# Patient Record
Sex: Male | Born: 1965 | Race: White | Hispanic: No | Marital: Single | Smoking: Former smoker
Health system: Southern US, Community
[De-identification: ages and names within clinical notes are randomized; demographics above are authoritative.]

## PROBLEM LIST (undated history)

## (undated) DIAGNOSIS — E039 Hypothyroidism, unspecified: Secondary | ICD-10-CM

## (undated) DIAGNOSIS — R4702 Dysphasia: Secondary | ICD-10-CM

## (undated) DIAGNOSIS — R569 Unspecified convulsions: Secondary | ICD-10-CM

## (undated) DIAGNOSIS — F329 Major depressive disorder, single episode, unspecified: Secondary | ICD-10-CM

## (undated) DIAGNOSIS — Q8711 Prader-Willi syndrome: Secondary | ICD-10-CM

## (undated) DIAGNOSIS — G4700 Insomnia, unspecified: Secondary | ICD-10-CM

## (undated) DIAGNOSIS — E119 Type 2 diabetes mellitus without complications: Secondary | ICD-10-CM

## (undated) DIAGNOSIS — F209 Schizophrenia, unspecified: Secondary | ICD-10-CM

## (undated) DIAGNOSIS — F32A Depression, unspecified: Secondary | ICD-10-CM

## (undated) DIAGNOSIS — I10 Essential (primary) hypertension: Secondary | ICD-10-CM

---
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
## 2005-12-29 ENCOUNTER — Emergency Department: Payer: Self-pay | Admitting: Emergency Medicine

## 2006-01-10 ENCOUNTER — Emergency Department: Payer: Self-pay | Admitting: Emergency Medicine

## 2006-08-07 ENCOUNTER — Emergency Department: Payer: Self-pay | Admitting: Emergency Medicine

## 2006-08-08 ENCOUNTER — Ambulatory Visit: Payer: Self-pay | Admitting: Emergency Medicine

## 2007-08-13 ENCOUNTER — Emergency Department: Payer: Self-pay | Admitting: Emergency Medicine

## 2008-09-03 ENCOUNTER — Ambulatory Visit: Payer: Self-pay | Admitting: Family Medicine

## 2009-03-22 ENCOUNTER — Emergency Department: Payer: Self-pay | Admitting: Emergency Medicine

## 2009-06-04 ENCOUNTER — Emergency Department: Payer: Self-pay

## 2009-08-10 ENCOUNTER — Encounter: Payer: Self-pay | Admitting: Family Medicine

## 2009-11-14 ENCOUNTER — Emergency Department: Payer: Self-pay | Admitting: Emergency Medicine

## 2018-11-14 ENCOUNTER — Other Ambulatory Visit: Payer: Self-pay | Admitting: Family Medicine

## 2018-11-14 ENCOUNTER — Other Ambulatory Visit: Payer: Self-pay | Admitting: Orthopaedic Surgery

## 2018-11-14 DIAGNOSIS — S42209A Unspecified fracture of upper end of unspecified humerus, initial encounter for closed fracture: Secondary | ICD-10-CM

## 2018-11-16 ENCOUNTER — Ambulatory Visit
Admission: RE | Admit: 2018-11-16 | Discharge: 2018-11-16 | Disposition: A | Discharge status: Home / Self Care | Payer: Medicare Other | Source: Ambulatory Visit | Attending: Orthopaedic Surgery | Admitting: Orthopaedic Surgery

## 2018-11-16 DIAGNOSIS — S42209A Unspecified fracture of upper end of unspecified humerus, initial encounter for closed fracture: Secondary | ICD-10-CM

## 2018-11-20 ENCOUNTER — Other Ambulatory Visit (HOSPITAL_COMMUNITY)
Admission: RE | Admit: 2018-11-20 | Discharge: 2018-11-20 | Disposition: A | Discharge status: Home / Self Care | Payer: Medicare Other | Source: Ambulatory Visit | Attending: Orthopaedic Surgery | Admitting: Orthopaedic Surgery

## 2018-11-20 DIAGNOSIS — Z01812 Encounter for preprocedural laboratory examination: Secondary | ICD-10-CM | POA: Insufficient documentation

## 2018-11-20 DIAGNOSIS — Z20828 Contact with and (suspected) exposure to other viral communicable diseases: Secondary | ICD-10-CM | POA: Insufficient documentation

## 2018-11-20 LAB — SARS CORONAVIRUS 2 (TAT 6-24 HRS): SARS Coronavirus 2: NEGATIVE

## 2018-11-20 NOTE — H&P (Signed)
PREOPERATIVE H&P  Chief Complaint: RIGHT HUMERAL FRACTURE  HPI: Michael Richmond is a 53 y.o. male who presents for preoperative history and physical prior to scheduled surgery, INTRAMEDULLARY (IM) NAIL HUMERAL.  Patient has a past medical history significant for Prader Wili Syndrom, schizoaffective disorder, DM, seizure disorder and traumatic brain injury. He lives in a group home.   He had a fall  off the bed on 11-11-2018. He  has pain in his right shoulder. He has been trying to use it but has pain afterwards.  He is unable to participate fully in he examination due to above patient factors. CT was obtained to better understand the fracture fragment.   CT scan of the right shoulder showed a Severely comminuted fracture of the surgical neck of the left proximal humerus with 2.5 cm of anterior displacement of the proximal shaft. Comminuted fracture of greater tuberosity without significant displacement.   His symptoms are rated as moderate to severe, and have been worsening.  This is significantly impairing activities of daily living.    Please see clinic note for further details on this patient's care.    He and his caregiver have elected for surgical management.   Social History   Socioeconomic History  . Marital status: Single    Spouse name: Not on file  . Number of children: Not on file  . Years of education: Not on file  . Highest education level: Not on file  Occupational History  . Not on file  Social Needs  . Financial resource strain: Not on file  . Food insecurity    Worry: Not on file    Inability: Not on file  . Transportation needs    Medical: Not on file    Non-medical: Not on file  Tobacco Use  . Smoking status: Not on file  Substance and Sexual Activity  . Alcohol use: Not on file  . Drug use: Not on file  . Sexual activity: Not on file  Lifestyle  . Physical activity    Days per week: Not on file    Minutes per session: Not on file  . Stress: Not on  file  Relationships  . Social Herbalist on phone: Not on file    Gets together: Not on file    Attends religious service: Not on file    Active member of club or organization: Not on file    Attends meetings of clubs or organizations: Not on file    Relationship status: Not on file  Other Topics Concern  . Not on file  Social History Narrative  . Not on file   No family history on file. Not on File Prior to Admission medications   Not on File    Positive ROS: All other systems have been reviewed and were otherwise negative with the exception of those mentioned in the HPI and as above.  Physical Exam: General: Alert, no acute distress Cardiovascular: No pedal edema Respiratory: No cyanosis, no use of accessory musculature GI: No organomegaly, abdomen is soft and non-tender Skin: No lesions in the area of chief complaint Neurologic: Sensation intact distally Psychiatric: Patient is competent for consent with normal mood and affect Lymphatic: No axillary or cervical lymphadenopathy  MUSCULOSKELETAL:  Right shoulder: a limited range of motion secondary to pain. Distal motor and sensory function is limited due to patient cooperation. He is able to wriggle the fingers and has reported sensation intact. Warm well perfused arm , distal extremity.  Imaging: CT right shoulder:  1. Severely comminuted fracture of the surgical neck of the left proximal humerus with 2.5 cm of anterior displacement of the proximal shaft. Comminuted fracture of greater tuberosity without significant displacement. No involvement of the articular surface of the humeral head. 2. Contusion versus partial tear of the anterior deltoid muscle which contains a few bony fragments.  Assessment: RIGHT HUMERAL FRACTURE  Plan: Plan for Procedure(s): INTRAMEDULLARY (IM) NAIL HUMERAL  We discussed with the patient and his caregiver regarding the results of his CT scan. The various methods of treatment  have been discussed with the patient and family. After consideration of risks, benefits and other options for treatment, the patient and his POA have consented to IM nail of right humerus fracture.   The risks benefits and alternatives were discussed with the patient including but not limited to the risks of nonoperative treatment, versus surgical intervention including infection, bleeding, nerve injury,  blood clots, cardiopulmonary complications, morbidity, mortality, among others, and they were willing to proceed.   The patient's POA, Bobbye Riggs, [phone],  acknowledged the explanation, agreed to proceed with the plan and consent was signed.   Operative Plan: IM nail right humerus fracture Discharge Medications: Standard (Tylenol, Mobic, Oxycodone, Zofran) DVT Prophylaxis: None Physical Therapy: Outpatient PT Special Discharge needs: Sling   Vernetta Honey, PA-C  11/20/2018 1:08 PM

## 2018-11-21 ENCOUNTER — Encounter (HOSPITAL_COMMUNITY): Payer: Self-pay | Admitting: *Deleted

## 2018-11-21 NOTE — Anesthesia Preprocedure Evaluation (Addendum)
Anesthesia Evaluation  Patient identified by MRN, date of birth, ID band Patient awake    Reviewed: Allergy & Precautions, NPO status , Patient's Chart, lab work & pertinent test results, reviewed documented beta blocker date and time   Airway Mallampati: III  TM Distance: >3 FB Neck ROM: Full    Dental no notable dental hx. (+) Teeth Intact   Pulmonary former smoker,    Pulmonary exam normal breath sounds clear to auscultation       Cardiovascular hypertension, Pt. on medications and Pt. on home beta blockers  Rhythm:Regular Rate:Bradycardia  EKG 11/22/2018 Sinus Bradycardia, otherwise normal   Neuro/Psych Seizures -, Well Controlled,  PSYCHIATRIC DISORDERS Depression Schizophrenia Prader-Willi syndrome Developmental delay- lives in long term care facility   GI/Hepatic negative GI ROS, Neg liver ROS,   Endo/Other  diabetes, Type 2, Oral Hypoglycemic AgentsHypothyroidism   Renal/GU negative Renal ROS  negative genitourinary   Musculoskeletal negative musculoskeletal ROS (+) Right proximal humeral Fx   Abdominal   Peds  Hematology   Anesthesia Other Findings   Reproductive/Obstetrics                         Anesthesia Physical Anesthesia Plan  ASA: III  Anesthesia Plan: General   Post-op Pain Management:  Regional for Post-op pain   Induction: Intravenous  PONV Risk Score and Plan: 3 and Ondansetron, Treatment may vary due to age or medical condition, Dexamethasone and Midazolam  Airway Management Planned: Oral ETT  Additional Equipment:   Intra-op Plan:   Post-operative Plan: Extubation in OR  Informed Consent: I have reviewed the patients History and Physical, chart, labs and discussed the procedure including the risks, benefits and alternatives for the proposed anesthesia with the patient or authorized representative who has indicated his/her understanding and acceptance.        Plan Discussed with: CRNA and Surgeon  Anesthesia Plan Comments: (PAT note written 11/21/2018 by Myra Gianotti, PA-C. SAME DAY WORK-UP   )     Anesthesia Quick Evaluation

## 2018-11-21 NOTE — Progress Notes (Addendum)
Michael Bach, RN at [facility] states patient denies shortness of breath, fever, cough and chest pain.  PCP -  Dr Koren Shiver Cardiologist - denies  Chest x-ray - denies EKG - DOS 11/22/18 Stress Test -denies  ECHO - denies Cardiac Cath - denies`  Fasting Blood Sugar -  unknown Checks Blood Sugar  1/week  . Do not take oral diabetes medicines (metformin) the morning of surgery.  . If your blood sugar is less than 70 mg/dL, you will need to treat for low blood sugar: o Treat a low blood sugar (less than 70 mg/dL) with  cup of clear juice (cranberry or apple), 4 glucose tablets, OR glucose gel. o Recheck blood sugar in 15 minutes after treatment (to make sure it is greater than 70 mg/dL). If your blood sugar is not greater than 70 mg/dL on recheck, call [phone] for further instructions.  ERAS: Clears til 4:15 am DOS, no drink.  Anesthesia review: Yes, Ebony Hail, PA  STOP taking any Aspirin (unless otherwise instructed by your surgeon), Aleve, Naproxen, Ibuprofen, Motrin, Advil, Goody's, BC's, all herbal medications, fish oil, and all vitamins.   Coronavirus Screening Covid test on 11/20/18 was negative.  Patient verbalized understanding of instructions that were given via phone.

## 2018-11-21 NOTE — Progress Notes (Signed)
Anesthesia Chart Review: SAME DAY WORK-UP   Case: [id number] Date/Time: 11/22/18 0700   Procedure: INTRAMEDULLARY (IM) NAIL HUMERAL (Right )   Anesthesia type: Choice   Pre-op diagnosis: RIGHT HUMERAL FRACTURE   Location: [facility] OR ROOM 05 / [place] [facility]   Surgeon: Hiram Gash, MD      DISCUSSION: Patient is a 53 year old male scheduled for the above procedure.  History includes Prader-Willi Syndrome, Schizophrenia, depression, seizures, DM2 (A1c 4.6% 10/31/18), hypothyroidism, dysphagia. Reportedly last HT 5 FT and WT 138 lb.  - [facility] cover sheet says patient also known as Glendell Docker (middle name), and included that he has a history of craniotomy for evacuation of SDH and also history of HTN, ambiguous genitalia, gynecomastia, and hyponatremia.   Received last visit from [facility] by Romana Juniper, MD on 9/01/15/18. Patient lives in a long term facility and has "developmental delay". Diabetes improved. HTN and seizures stable. (No seizures in > 2 years by [facility] staff.)  Labs on 10/31/18 outlined below.  COVID-19 test negative on 11/20/18. He is a same day work-up, so further evaluation by his anesthesia team on the day of surgery.   VS: For day of surgery.   PROVIDERS: Romana Juniper, MD is PCP   LABS: Labs as of 10/31/18 (sent from [place]) show: WBC 7.2, hemoglobin 11.6, hematocrit 34.6, platelet count 267, glucose 73, BUN 14, creatinine 0.88, potassium 4.9, sodium 140, calcium 9.6, albumin 3.8, total bilirubin 0.2, alkaline phosphatase 72, AST 16, ALT 9, A1c 4.7%, prolactin 24.8 (H), TSH 0.443 (L)   IMAGES: CT right shoulder 11/16/18: IMPRESSION: 1. Severely comminuted fracture of the surgical neck of the left proximal humerus with 2.5 cm of anterior displacement of the proximal shaft. Comminuted fracture of greater tuberosity without significant displacement. No involvement of the articular surface of the humeral head. 2. Contusion versus partial tear of the anterior deltoid  muscle which contains a few bony fragments.   EKG: Based on history, would meet anesthesia criteria for EKG--unless deferred by his assigned anesthesiologist.    CV: N/A   Past Medical History:  Diagnosis Date  . Depression   . Diabetes mellitus without complication ([place])    type 2  . Dysphasia   . Hypertension   . Hypothyroidism   . Insomnia   . Prader-Willi syndrome   . Schizophrenia ([place])   . Seizures ([place])    no seizures for the last 2 years    History reviewed. No pertinent surgical history.  MEDICATIONS: No current facility-administered medications for this encounter.    . bisacodyl (DULCOLAX) 5 MG EC tablet  . cetaphil (CETAPHIL) lotion  . chlorhexidine (PERIDEX) 0.12 % solution  . Cholecalciferol (KP VITAMIN D3) 50 MCG (2000 UT) CAPS  . enalapril (VASOTEC) 20 MG tablet  . EPINEPHrine 0.3 mg/0.3 mL IJ SOAJ injection  . FLUoxetine (PROZAC) 10 MG capsule  . fluticasone (FLONASE) 50 MCG/ACT nasal spray  . ketoconazole (NIZORAL) 2 % shampoo  . levothyroxine (SYNTHROID) 125 MCG tablet  . metFORMIN (GLUCOPHAGE) 500 MG tablet  . OLANZapine (ZYPREXA) 5 MG tablet  . Petrolatum 42 % OINT  . propranolol ER (INDERAL LA) 80 MG 24 hr capsule  . risperiDONE (RISPERDAL M-TABS) 0.5 MG disintegrating tablet     Myra Gianotti, PA-C Surgical Short Stay/Anesthesiology [facility] Phone [phone] [facility] Phone [phone] 11/21/2018 4:39 PM

## 2018-11-22 ENCOUNTER — Encounter (HOSPITAL_COMMUNITY): Payer: Self-pay | Admitting: Certified Registered"

## 2018-11-22 ENCOUNTER — Ambulatory Visit (HOSPITAL_COMMUNITY): Payer: Medicare Other | Admitting: Vascular Surgery

## 2018-11-22 ENCOUNTER — Ambulatory Visit (HOSPITAL_COMMUNITY)
Admission: RE | Admit: 2018-11-22 | Discharge: 2018-11-22 | Disposition: A | Discharge status: Home / Self Care | Payer: Medicare Other | Attending: Orthopaedic Surgery | Admitting: Orthopaedic Surgery

## 2018-11-22 ENCOUNTER — Ambulatory Visit (HOSPITAL_COMMUNITY): Payer: Medicare Other

## 2018-11-22 ENCOUNTER — Other Ambulatory Visit: Payer: Self-pay

## 2018-11-22 ENCOUNTER — Encounter (HOSPITAL_COMMUNITY)
Admission: RE | Disposition: A | Discharge status: Home / Self Care | Payer: Self-pay | Source: Home / Self Care | Attending: Orthopaedic Surgery

## 2018-11-22 DIAGNOSIS — Z419 Encounter for procedure for purposes other than remedying health state, unspecified: Secondary | ICD-10-CM

## 2018-11-22 DIAGNOSIS — Z79890 Hormone replacement therapy (postmenopausal): Secondary | ICD-10-CM | POA: Diagnosis not present

## 2018-11-22 DIAGNOSIS — E119 Type 2 diabetes mellitus without complications: Secondary | ICD-10-CM | POA: Diagnosis not present

## 2018-11-22 DIAGNOSIS — F329 Major depressive disorder, single episode, unspecified: Secondary | ICD-10-CM | POA: Diagnosis not present

## 2018-11-22 DIAGNOSIS — Z87891 Personal history of nicotine dependence: Secondary | ICD-10-CM | POA: Diagnosis not present

## 2018-11-22 DIAGNOSIS — Z79899 Other long term (current) drug therapy: Secondary | ICD-10-CM | POA: Insufficient documentation

## 2018-11-22 DIAGNOSIS — F259 Schizoaffective disorder, unspecified: Secondary | ICD-10-CM | POA: Insufficient documentation

## 2018-11-22 DIAGNOSIS — I10 Essential (primary) hypertension: Secondary | ICD-10-CM | POA: Diagnosis not present

## 2018-11-22 DIAGNOSIS — S42212A Unspecified displaced fracture of surgical neck of left humerus, initial encounter for closed fracture: Secondary | ICD-10-CM | POA: Diagnosis not present

## 2018-11-22 DIAGNOSIS — Q8711 Prader-Willi syndrome: Secondary | ICD-10-CM | POA: Diagnosis not present

## 2018-11-22 DIAGNOSIS — W06XXXA Fall from bed, initial encounter: Secondary | ICD-10-CM | POA: Insufficient documentation

## 2018-11-22 DIAGNOSIS — Z09 Encounter for follow-up examination after completed treatment for conditions other than malignant neoplasm: Secondary | ICD-10-CM

## 2018-11-22 DIAGNOSIS — E039 Hypothyroidism, unspecified: Secondary | ICD-10-CM | POA: Insufficient documentation

## 2018-11-22 DIAGNOSIS — Z7984 Long term (current) use of oral hypoglycemic drugs: Secondary | ICD-10-CM | POA: Insufficient documentation

## 2018-11-22 HISTORY — DX: Type 2 diabetes mellitus without complications: E11.9

## 2018-11-22 HISTORY — DX: Essential (primary) hypertension: I10

## 2018-11-22 HISTORY — DX: Insomnia, unspecified: G47.00

## 2018-11-22 HISTORY — DX: Unspecified convulsions: R56.9

## 2018-11-22 HISTORY — DX: Depression, unspecified: F32.A

## 2018-11-22 HISTORY — DX: Schizophrenia, unspecified: F20.9

## 2018-11-22 HISTORY — PX: HUMERUS IM NAIL: SHX1769

## 2018-11-22 HISTORY — DX: Hypothyroidism, unspecified: E03.9

## 2018-11-22 HISTORY — DX: Dysphasia: R47.02

## 2018-11-22 HISTORY — DX: Prader-Willi syndrome: Q87.11

## 2018-11-22 LAB — CBC
HCT: 26.7 % — ABNORMAL LOW (ref 39.0–52.0)
Hemoglobin: 8.4 g/dL — ABNORMAL LOW (ref 13.0–17.0)
MCH: 30.4 pg (ref 26.0–34.0)
MCHC: 31.5 g/dL (ref 30.0–36.0)
MCV: 96.7 fL (ref 80.0–100.0)
Platelets: 288 10*3/uL (ref 150–400)
RBC: 2.76 MIL/uL — ABNORMAL LOW (ref 4.22–5.81)
RDW: 15.3 % (ref 11.5–15.5)
WBC: 4.9 10*3/uL (ref 4.0–10.5)
nRBC: 0 % (ref 0.0–0.2)

## 2018-11-22 LAB — BASIC METABOLIC PANEL
Anion gap: 9 (ref 5–15)
BUN: 17 mg/dL (ref 6–20)
CO2: 29 mmol/L (ref 22–32)
Calcium: 9.1 mg/dL (ref 8.9–10.3)
Chloride: 100 mmol/L (ref 98–111)
Creatinine, Ser: 1.03 mg/dL (ref 0.61–1.24)
GFR calc Af Amer: >60 mL/min (ref >60)
GFR calc non Af Amer: >60 mL/min (ref >60)
Glucose, Bld: 71 mg/dL (ref 70–99)
Potassium: 4.3 mmol/L (ref 3.5–5.1)
Sodium: 138 mmol/L (ref 135–145)

## 2018-11-22 LAB — GLUCOSE, CAPILLARY
Glucose-Capillary: 67 mg/dL — ABNORMAL LOW (ref 70–99)
Glucose-Capillary: 111 mg/dL — ABNORMAL HIGH (ref 70–99)
Glucose-Capillary: 65 mg/dL — ABNORMAL LOW (ref 70–99)
Glucose-Capillary: 80 mg/dL (ref 70–99)
Glucose-Capillary: 147 mg/dL — ABNORMAL HIGH (ref 70–99)

## 2018-11-22 SURGERY — INSERTION, INTRAMEDULLARY ROD, HUMERUS
Anesthesia: General | Site: Arm Upper | Laterality: Right

## 2018-11-22 MED ORDER — PHENYLEPHRINE HCL (PRESSORS) 10 MG/ML IV SOLN
INTRAVENOUS | Status: DC | PRN
  Administered 2018-11-22 (×2): 80 ug via INTRAVENOUS

## 2018-11-22 MED ORDER — 0.9 % SODIUM CHLORIDE (POUR BTL) OPTIME
TOPICAL | Status: DC | PRN
  Administered 2018-11-22: 1000 mL

## 2018-11-22 MED ORDER — BUPIVACAINE HCL 0.5 % IJ SOLN
INTRAMUSCULAR | Status: DC | PRN
  Administered 2018-11-22: 15 mL

## 2018-11-22 MED ORDER — ONDANSETRON HCL 4 MG/2ML IJ SOLN
INTRAMUSCULAR | Status: AC
  Filled 2018-11-22: qty 2

## 2018-11-22 MED ORDER — DEXTROSE 50 % IV SOLN
12.5000 g | Freq: Once | INTRAVENOUS | Status: AC
  Administered 2018-11-22: 07:00:00 6.25 g via INTRAVENOUS
  Filled 2018-11-22: qty 50

## 2018-11-22 MED ORDER — MIDAZOLAM HCL 5 MG/5ML IJ SOLN
INTRAMUSCULAR | Status: DC | PRN
  Administered 2018-11-22: 2 mg via INTRAVENOUS

## 2018-11-22 MED ORDER — LIDOCAINE 2% (20 MG/ML) 5 ML SYRINGE: INTRAMUSCULAR | Status: DC | PRN

## 2018-11-22 MED ORDER — MIDAZOLAM HCL 2 MG/2ML IJ SOLN
INTRAMUSCULAR | Status: AC
  Filled 2018-11-22: qty 2

## 2018-11-22 MED ORDER — PHENYLEPHRINE HCL-NACL 10-0.9 MG/250ML-% IV SOLN
INTRAVENOUS | Status: DC | PRN
  Administered 2018-11-22: 25 ug/min via INTRAVENOUS

## 2018-11-22 MED ORDER — CEFAZOLIN SODIUM-DEXTROSE 2-3 GM-%(50ML) IV SOLR
INTRAVENOUS | Status: DC | PRN
  Administered 2018-11-22: 2 g via INTRAVENOUS

## 2018-11-22 MED ORDER — EPHEDRINE SULFATE-NACL 50-0.9 MG/10ML-% IV SOSY
PREFILLED_SYRINGE | INTRAVENOUS | Status: DC | PRN
  Administered 2018-11-22 (×3): 10 mg via INTRAVENOUS

## 2018-11-22 MED ORDER — ACETAMINOPHEN 500 MG PO TABS: 1000.0000 mg | ORAL_TABLET | Freq: Three times a day (TID) | ORAL | 0 refills | Status: AC

## 2018-11-22 MED ORDER — BUPIVACAINE LIPOSOME 1.3 % IJ SUSP
INTRAMUSCULAR | Status: DC | PRN
  Administered 2018-11-22: 10 mL via PERINEURAL

## 2018-11-22 MED ORDER — DEXAMETHASONE SODIUM PHOSPHATE 10 MG/ML IJ SOLN
INTRAMUSCULAR | Status: AC
  Filled 2018-11-22: qty 1

## 2018-11-22 MED ORDER — PROPOFOL 10 MG/ML IV BOLUS: INTRAVENOUS | Status: DC | PRN

## 2018-11-22 MED ORDER — FENTANYL CITRATE (PF) 100 MCG/2ML IJ SOLN
INTRAMUSCULAR | Status: DC | PRN
  Administered 2018-11-22: 50 ug via INTRAVENOUS

## 2018-11-22 MED ORDER — DEXAMETHASONE SODIUM PHOSPHATE 10 MG/ML IJ SOLN
INTRAMUSCULAR | Status: DC | PRN
  Administered 2018-11-22: 4 mg via INTRAVENOUS

## 2018-11-22 MED ORDER — CEFAZOLIN SODIUM-DEXTROSE 2-4 GM/100ML-% IV SOLN
INTRAVENOUS | Status: AC
  Filled 2018-11-22: qty 100

## 2018-11-22 MED ORDER — ONDANSETRON HCL 4 MG PO TABS: 4.0000 mg | ORAL_TABLET | Freq: Three times a day (TID) | ORAL | 1 refills | Status: AC | PRN

## 2018-11-22 MED ORDER — DEXTROSE 50 % IV SOLN
12.5000 g | INTRAVENOUS | Status: AC
  Administered 2018-11-22: 12.5 g via INTRAVENOUS

## 2018-11-22 MED ORDER — PROPOFOL 10 MG/ML IV BOLUS
INTRAVENOUS | Status: AC
  Filled 2018-11-22: qty 20

## 2018-11-22 MED ORDER — LIDOCAINE 2% (20 MG/ML) 5 ML SYRINGE
INTRAMUSCULAR | Status: AC
  Filled 2018-11-22: qty 5

## 2018-11-22 MED ORDER — LIDOCAINE 2% (20 MG/ML) 5 ML SYRINGE
INTRAMUSCULAR | Status: DC | PRN
  Administered 2018-11-22: 60 mg via INTRAVENOUS

## 2018-11-22 MED ORDER — OXYCODONE HCL 5 MG PO TABS: ORAL_TABLET | ORAL | 0 refills | Status: AC

## 2018-11-22 MED ORDER — VANCOMYCIN HCL 1000 MG IV SOLR
INTRAVENOUS | Status: AC
  Filled 2018-11-22: qty 1000

## 2018-11-22 MED ORDER — FENTANYL CITRATE (PF) 250 MCG/5ML IJ SOLN
INTRAMUSCULAR | Status: AC
  Filled 2018-11-22: qty 5

## 2018-11-22 MED ORDER — VANCOMYCIN HCL 1000 MG IV SOLR
INTRAVENOUS | Status: DC | PRN
  Administered 2018-11-22: 1000 mg

## 2018-11-22 MED ORDER — MELOXICAM 7.5 MG PO TABS: 7.5000 mg | ORAL_TABLET | Freq: Every day | ORAL | 2 refills | Status: AC

## 2018-11-22 MED ORDER — ROCURONIUM BROMIDE 10 MG/ML (PF) SYRINGE
PREFILLED_SYRINGE | INTRAVENOUS | Status: DC | PRN
  Administered 2018-11-22: 50 mg via INTRAVENOUS

## 2018-11-22 MED ORDER — ONDANSETRON HCL 4 MG/2ML IJ SOLN
INTRAMUSCULAR | Status: DC | PRN
  Administered 2018-11-22: 4 mg via INTRAVENOUS

## 2018-11-22 MED ORDER — LACTATED RINGERS IV SOLN
INTRAVENOUS | Status: DC | PRN
  Administered 2018-11-22 (×2): via INTRAVENOUS

## 2018-11-22 MED ORDER — DEXTROSE 50 % IV SOLN
INTRAVENOUS | Status: AC
  Administered 2018-11-22: 6.25 g via INTRAVENOUS
  Filled 2018-11-22: qty 50

## 2018-11-22 MED ORDER — ROCURONIUM BROMIDE 10 MG/ML (PF) SYRINGE
PREFILLED_SYRINGE | INTRAVENOUS | Status: AC
  Filled 2018-11-22: qty 10

## 2018-11-22 MED ORDER — PROPOFOL 10 MG/ML IV BOLUS
INTRAVENOUS | Status: DC | PRN
  Administered 2018-11-22: 100 mg via INTRAVENOUS

## 2018-11-22 SURGICAL SUPPLY — 43 items
BIT DRILL 35MM (DRILL) ×1
BIT DRILL 3X200 (DRILL) ×2 IMPLANT
BNDG COHESIVE 4X5 TAN STRL (GAUZE/BANDAGES/DRESSINGS) ×3 IMPLANT
CLOSURE STERI-STRIP 1/2X4 (GAUZE/BANDAGES/DRESSINGS) ×2
CLSR STERI-STRIP ANTIMIC 1/2X4 (GAUZE/BANDAGES/DRESSINGS) ×4 IMPLANT
COVER SURGICAL LIGHT HANDLE (MISCELLANEOUS) ×3 IMPLANT
COVER WAND RF STERILE (DRAPES) ×3 IMPLANT
DRAPE ORTHO SPLIT 77X108 STRL (DRAPES)
DRAPE SURG ORHT 6 SPLT 77X108 (DRAPES) IMPLANT
DRSG AQUACEL AG ADV 3.5X 4 (GAUZE/BANDAGES/DRESSINGS) ×3 IMPLANT
DRSG TEGADERM 2-3/8X2-3/4 SM (GAUZE/BANDAGES/DRESSINGS) ×12 IMPLANT
DURAPREP 26ML APPLICATOR (WOUND CARE) ×6 IMPLANT
ELECT REM PT RETURN 9FT ADLT (ELECTROSURGICAL) ×3
ELECTRODE REM PT RTRN 9FT ADLT (ELECTROSURGICAL) ×1 IMPLANT
GAUZE SPONGE 4X4 12PLY STRL LF (GAUZE/BANDAGES/DRESSINGS) ×3 IMPLANT
GLOVE BIOGEL PI IND STRL 8 (GLOVE) ×1 IMPLANT
GLOVE BIOGEL PI INDICATOR 8 (GLOVE) ×2
GLOVE ECLIPSE 8.0 STRL XLNG CF (GLOVE) ×6 IMPLANT
GOWN STRL REUS W/ TWL LRG LVL3 (GOWN DISPOSABLE) ×3 IMPLANT
GOWN STRL REUS W/TWL 2XL LVL3 (GOWN DISPOSABLE) IMPLANT
GOWN STRL REUS W/TWL LRG LVL3 (GOWN DISPOSABLE) ×6
KIT STABILIZATION SHOULDER (MISCELLANEOUS) ×3 IMPLANT
KIT TURNOVER KIT B (KITS) ×3 IMPLANT
MARKER WIRE 150MM SINGLE USE (MARKER) ×3 IMPLANT
NAIL HUMERAL RIGHT (Nail) ×3 IMPLANT
NS IRRIG 1000ML POUR BTL (IV SOLUTION) ×3 IMPLANT
PACK SHOULDER (CUSTOM PROCEDURE TRAY) ×3 IMPLANT
PAD ARMBOARD 7.5X6 YLW CONV (MISCELLANEOUS) ×3 IMPLANT
SCREW DISTAL 4.3X24MM (Screw) ×3 IMPLANT
SCREW PROXIMAL 5X28MM (Screw) ×3 IMPLANT
SCREW PROXIMAL 5X36MM (Screw) ×3 IMPLANT
SCREW PROXIMAL CANN 5X44MM (Screw) ×3 IMPLANT
SLING ARM FOAM STRAP LRG (SOFTGOODS) ×3 IMPLANT
SUT FIBERWIRE #2 38 T-5 BLUE (SUTURE) ×3
SUT MNCRL AB 4-0 PS2 18 (SUTURE) ×6 IMPLANT
SUT VIC AB 0 CT1 27 (SUTURE) ×2
SUT VIC AB 0 CT1 27XBRD ANBCTR (SUTURE) ×1 IMPLANT
SUT VIC AB 3-0 FS2 27 (SUTURE) ×3 IMPLANT
SUTURE FIBERWR #2 38 T-5 BLUE (SUTURE) ×1 IMPLANT
TOWEL GREEN STERILE (TOWEL DISPOSABLE) ×3 IMPLANT
TOWEL GREEN STERILE FF (TOWEL DISPOSABLE) ×3 IMPLANT
WIRE GUIDE MODEL 22X500MM (WIRE) ×3 IMPLANT
YANKAUER SUCT BULB TIP NO VENT (SUCTIONS) ×3 IMPLANT

## 2018-11-22 NOTE — Progress Notes (Signed)
Per Rica Records, House manager for group home, stated that per paper work in chart, a representative from group home can sign the consent form on the patient and legal guardian's behalf.

## 2018-11-22 NOTE — Progress Notes (Signed)
Attempted to call patient's mother/legal guardian x2, no answer.

## 2018-11-22 NOTE — Op Note (Signed)
Orthopaedic Surgery Operative Note (CSN: [id number])  Michael Richmond  [birth year] Date of Surgery: 11/22/2018   Diagnoses:  Right comminuted displaced proximal humerus fracture  Procedure: Right humeral nailing   Operative Finding Successful completion of the planned procedure.  Patient's fixation was relatively robust and the fracture fragments of the proximal humerus fracture and greater tuberosity did not displace.  Were happy with her overall fixation.  Implants: Tornier 130 mm short nail with 3 proximal interlocks and one distal interlock.  Post-operative plan: The patient will be nonweightbearing in a sling with range of motion to start 2 to 3 weeks after surgery.  The patient will be discharged home.  DVT prophylaxis not indicated in this ambulatory upper extremity patient without significant risk factors.   Pain control with PRN pain medication preferring oral medicines.  Follow up plan will be scheduled in approximately 7 days for incision check and XR.  Post-Op Diagnosis: Same Surgeons:Primary: Hiram Gash, MD Assistants:Caroline McBane PA-C Location: [facility] OR ROOM 05 Anesthesia: General with regional anesthesia Antibiotics: Ancef 2 g with local vancomycin powder 1 g at the surgical site Tourniquet time: * No tourniquets in log * Estimated Blood Loss: Minimal Complications: None Specimens: None Implants: Implant Name Type Inv. Item Serial No. Manufacturer Lot No. LRB No. Used Action  NAIL HUMERAL RIGHT - [id number] Nail NAIL HUMERAL RIGHT  [employer] [id number] Right 1 Implanted  SCREW PROXIMAL 5X36MM - [id number] Screw SCREW PROXIMAL 5X36MM  [employer]  Right 1 Implanted  SCREW PROXIMAL CANN 5X44MM - [id number] Screw SCREW PROXIMAL CANN 5X44MM  [employer]  Right 1 Implanted  SCREW PROXIMAL 5X28MM - [id number] Screw SCREW PROXIMAL 5X28MM  [employer]  Right 1 Implanted  SCREW DISTAL 4.3X24MM - [id number] Screw SCREW DISTAL 4.3X24MM  [employer]  Right 1 Implanted     Indications for Surgery:   Michael Richmond is a 53 y.o. male with fall out of bed in the setting of multiple medical comorbidities including Prader-Willi syndrome.  Patient had 150% displaced proximal humerus fracture with anteriorization of the shaft and comminution.  We felt that extensive exposure which would be required for a open reduction internal fixation with a plate would be not in the patient's best interest.  We instead felt that intramedullary nailing would likely provide good fixation with a limited exposure.  Benefits and risks of operative and nonoperative management were discussed prior to surgery with patient/guardian(s) and informed consent form was completed.  Specific risks including infection, need for additional surgery, AVN, nonunion, malunion, stiffness and need for hardware removal   Procedure:   The patient was identified properly. Informed consent was obtained and the surgical site was marked. The patient was taken up to suite where general anesthesia was induced.  The patient was positioned beachchair on [employer] table.  The right shoulder was prepped and draped in the usual sterile fashion.  Timeout was performed before the beginning of the case.  We began with a mini open exposure to the shoulder consistent with a mini open rotator cuff exposure.  We went through the skin sharply achieving hemostasis we progressed.  At that point we opened the deltoid fascia and split along the raphae between the anterior and lateral deltoid.  Took care to stay just off the acromion.  At that point we had exposure to the rotator cuff in the subacromial space.  Fracture hematoma was encountered.  We performed a bursectomy with a scissor and cleared the subacromial space.  At that point  we used orthogonal fluoroscopic views to place a center center starting awl and advanced into the proximal fragment.  This point we were able to place a guidewire through the awl into the distal fragment  crossing the comminuted fracture site.  This was in place on orthogonal views.  We then proceeded to place our nail over the wire as the all was a starting reamer for the proximal aspect of the nail.  We used fluoroscopy to check her nail height and placed 3 proximal interlock screws through the jig and one distal screw taking care to dissect down carefully with a tonsil prior to placing our guides to avoid neurovascular damage.  Were happy with the overall fixation and the construct was stable under live fluoroscopy.  We irrigated the wound copiously before placing local antibiotic as listed above.  The small rent in the muscular portion of the rotator cuff was closed with heavy nonabsorbable suture.  The rotator cuff is intact at the end of the case.  Close the incision in a multilayer fashion with absorbable suture.  Sterile dressing was placed.  Patient was awoken taken to PACU in stable condition.  Alfonse Alpers, PA-C, present and scrubbed throughout the case, critical for completion in a timely fashion, and for retraction, instrumentation, closure.

## 2018-11-22 NOTE — Anesthesia Procedure Notes (Signed)
Procedure Name: Intubation Date/Time: 11/22/2018 7:33 AM Performed by: Moshe Salisbury, CRNA Pre-anesthesia Checklist: Patient identified, Emergency Drugs available, Suction available and Patient being monitored Patient Re-evaluated:Patient Re-evaluated prior to induction Oxygen Delivery Method: Circle System Utilized Preoxygenation: Pre-oxygenation with 100% oxygen Induction Type: IV induction Ventilation: Mask ventilation without difficulty Laryngoscope Size: Mac and 3 Grade View: Grade II Tube type: Oral Tube size: 7.5 mm Number of attempts: 1 Airway Equipment and Method: Stylet and Oral airway Placement Confirmation: ETT inserted through vocal cords under direct vision,  positive ETCO2 and breath sounds checked- equal and bilateral Secured at: 21 cm Tube secured with: Tape Dental Injury: Teeth and Oropharynx as per pre-operative assessment

## 2018-11-22 NOTE — Anesthesia Procedure Notes (Signed)
Anesthesia Regional Block: Interscalene brachial plexus block   Pre-Anesthetic Checklist: ,, timeout performed, Correct Patient, Correct Site, Correct Laterality, Correct Procedure, Correct Position, site marked, Risks and benefits discussed,  Surgical consent,  Pre-op evaluation,  At surgeon's request and post-op pain management  Laterality: Right  Prep: chloraprep       Needles:  Injection technique: Single-shot  Needle Type: Echogenic Stimulator Needle     Needle Length: 9cm  Needle Gauge: 21   Needle insertion depth: 6 cm   Additional Needles:   Procedures:,,,, ultrasound used (permanent image in chart),,,,  Narrative:  Start time: 11/22/2018 7:10 AM End time: 11/22/2018 7:15 AM Injection made incrementally with aspirations every 5 mL.  Performed by: Personally  Anesthesiologist: Josephine Igo, MD  Additional Notes: Timeout performed. Patient sedated. Relevant anatomy ID'd using [place]. Incremental 2-42ml injection of LA with frequent aspiration. Patient tolerated procedure well.

## 2018-11-22 NOTE — Transfer of Care (Signed)
Immediate Anesthesia Transfer of Care Note  Patient: Michael Richmond  Procedure(s) Performed: INTRAMEDULLARY (IM) NAIL HUMERAL (Right Arm Upper)  Patient Location: PACU  Anesthesia Type:GA combined with regional for post-op pain  Level of Consciousness: drowsy and patient cooperative  Airway & Oxygen Therapy: Patient Spontanous Breathing and Patient connected to nasal cannula oxygen  Post-op Assessment: Report given to RN, Post -op Vital signs reviewed and stable and Patient moving all extremities  Post vital signs: Reviewed and stable  Last Vitals:  Vitals Value Taken Time  BP 112/64 11/22/18 0925  Temp    Pulse 50 11/22/18 0926  Resp 10 11/22/18 0926  SpO2 100 % 11/22/18 0926  Vitals shown include unvalidated device data.  Last Pain:  Vitals:   11/22/18 0925  TempSrc:   PainSc: (P) Asleep      Patients Stated Pain Goal: 0 (89/16/94 5038)  Complications: No apparent anesthesia complications

## 2018-11-22 NOTE — Anesthesia Postprocedure Evaluation (Signed)
Anesthesia Post Note  Patient: Clinton Sawyer  Procedure(s) Performed: INTRAMEDULLARY (IM) NAIL HUMERAL (Right Arm Upper)     Patient location during evaluation: PACU Anesthesia Type: General Level of consciousness: awake and alert Pain management: pain level controlled Vital Signs Assessment: post-procedure vital signs reviewed and stable Respiratory status: spontaneous breathing, nonlabored ventilation and respiratory function stable Cardiovascular status: blood pressure returned to baseline and stable Postop Assessment: no apparent nausea or vomiting Anesthetic complications: no Comments: No complaints of pain.    Last Vitals:  Vitals:   11/22/18 0945 11/22/18 0955  BP:  106/69  Pulse: (!) 52 (!) 51  Resp: 13 12  Temp:  36.5 C  SpO2: 95% 96%    Last Pain:  Vitals:   11/22/18 0945  TempSrc:   PainSc: 0-No pain                 Buren Havey A.

## 2018-11-22 NOTE — Interval H&P Note (Signed)
History and Physical Interval Note:  11/22/2018 7:16 AM  Danner C Robbins  has presented today for surgery, with the diagnosis of RIGHT HUMERAL FRACTURE.  The various methods of treatment have been discussed with the patient and family. After consideration of risks, benefits and other options for treatment, the patient has consented to  Procedure(s): INTRAMEDULLARY (IM) NAIL HUMERAL (Right) as a surgical intervention.  The patient's history has been reviewed, patient examined, no change in status, stable for surgery.  I have reviewed the patient's chart and labs.  Questions were answered to the patient's satisfaction.     Hiram Gash

## 2018-11-22 NOTE — Progress Notes (Signed)
Patient's CBG 67 upon arrival to short stay.  Received verbal order from Dr. Royce Macadamia to give 1/4 amp [id number]

## 2018-11-23 ENCOUNTER — Encounter (HOSPITAL_COMMUNITY): Payer: Self-pay | Admitting: Orthopaedic Surgery

## 2020-06-18 IMAGING — CT CT SHOULDER*R* W/O CM
1 series · 12 of 14 positions shown, 15 images · non-contrast
Comparison: None.

CLINICAL DATA: Status post fall, right proximal humeral fracture.

EXAM:
CT OF THE UPPER RIGHT EXTREMITY WITHOUT CONTRAST
TECHNIQUE: Multidetector CT imaging of the upper right extremity was performed
according to the standard protocol.

[Series 2: shoulder 2.00 br40 s3 axial soft · axial · 0.42mm/px · z∈[-983,-833]mm · 12 of 89 slices shown, 15 images]
[im 7/89  soft-tissue]
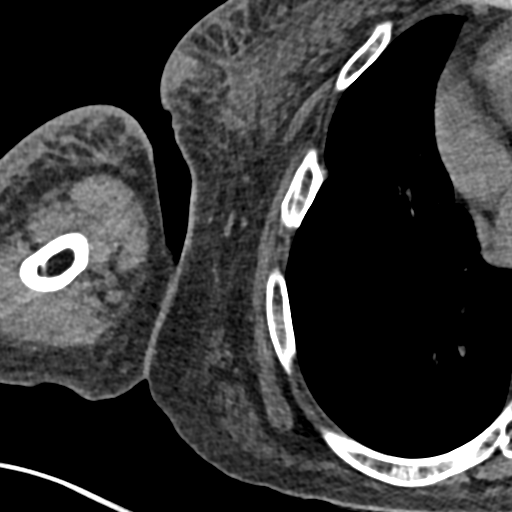
[im 7/89  bone]
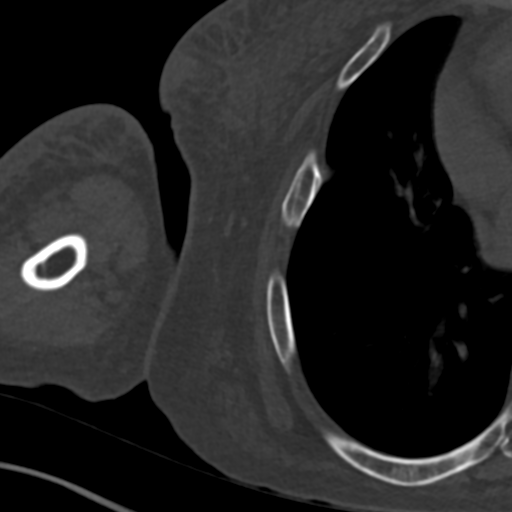
[im 14/89  bone]
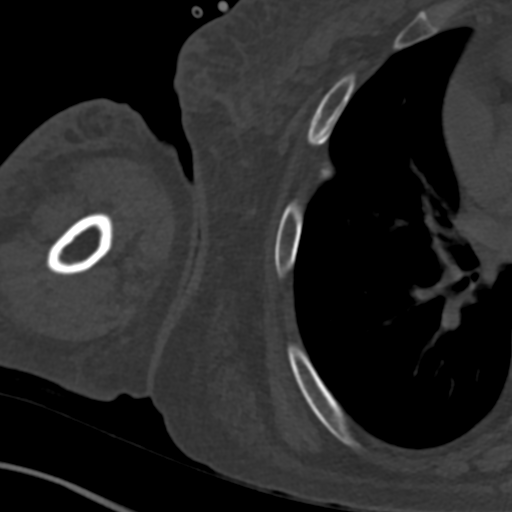
[im 21/89  bone]
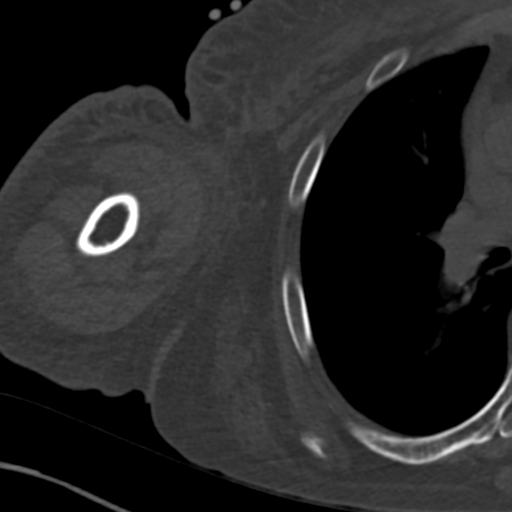
[im 28/89  bone]
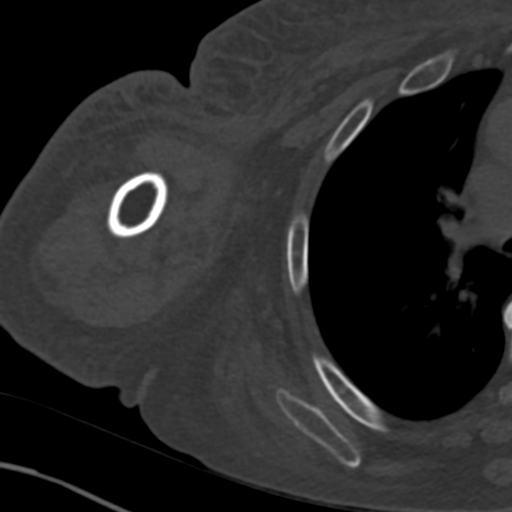
[im 34/89  soft-tissue]
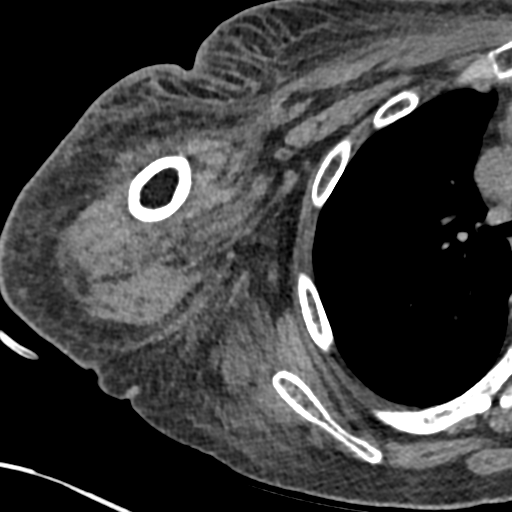
[im 34/89  bone]
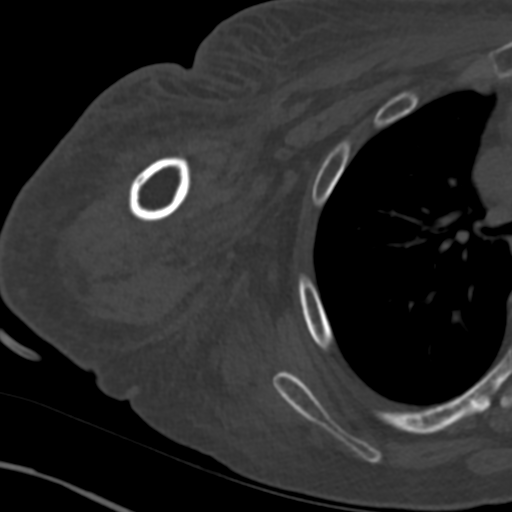
[im 41/89  bone]
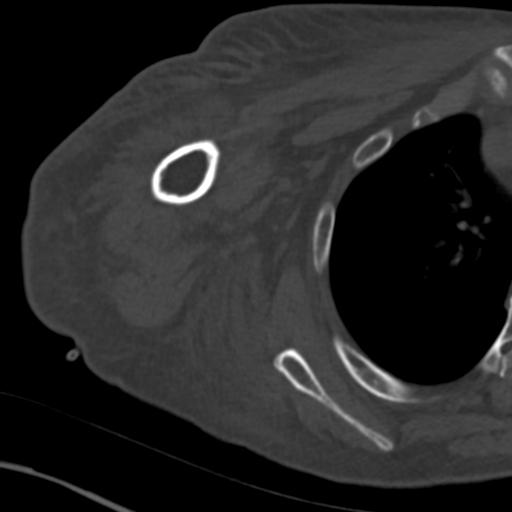
[im 48/89  bone]
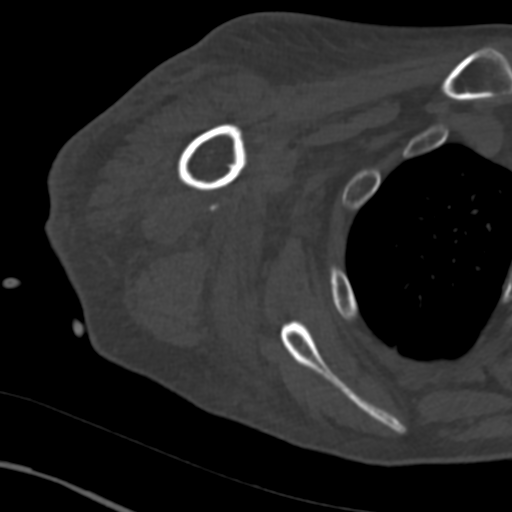
[im 55/89  bone]
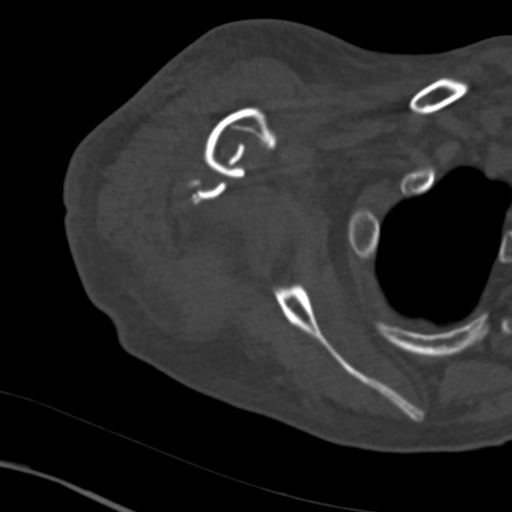
[im 61/89  soft-tissue]
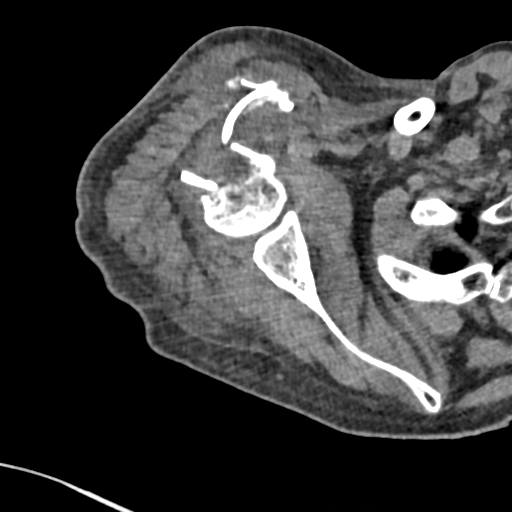
[im 61/89  bone]
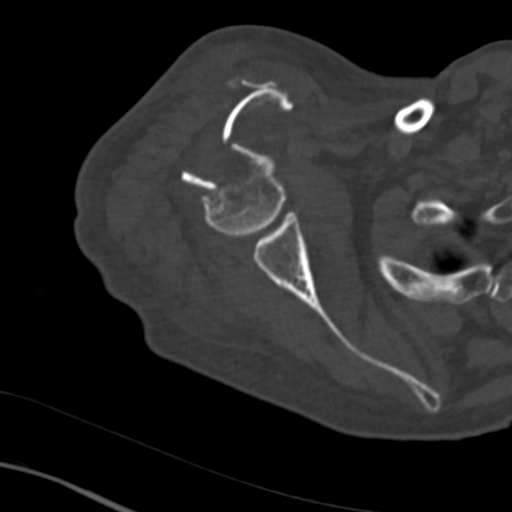
[im 68/89  bone]
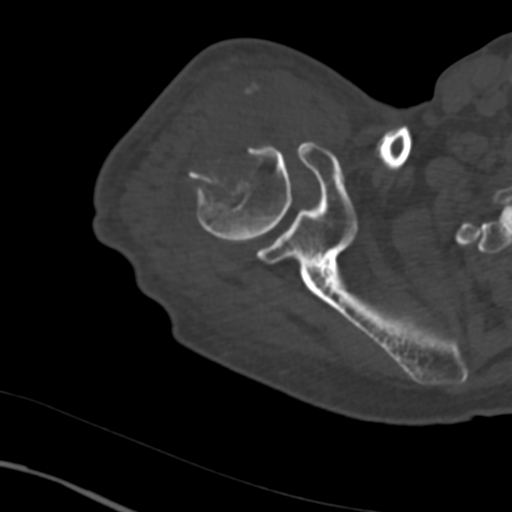
[im 75/89  bone]
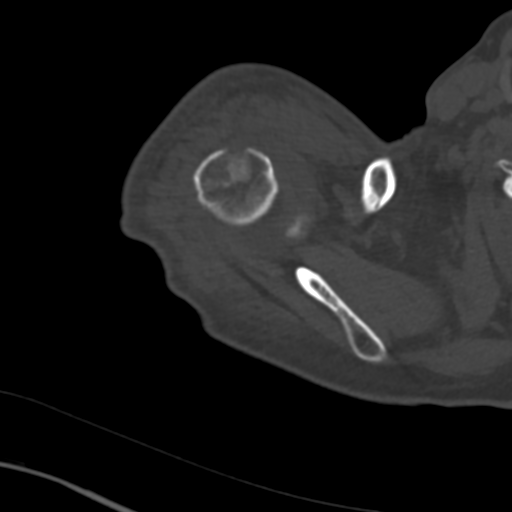
[im 82/89  bone]
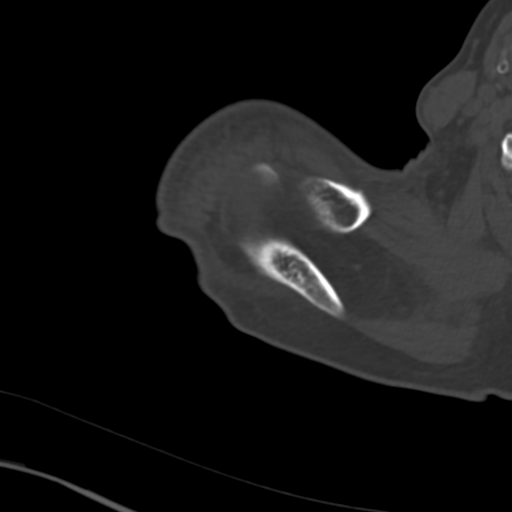

[12 of 14 positions shown; findings below may reference images not displayed]

FINDINGS: Bones/Joint/Cartilage

Severely comminuted fracture of the surgical neck of the left
proximal humerus with 2.5 cm of anterior displacement of the
proximal shaft. Comminuted fracture of greater tuberosity without
significant displacement. No involvement of the articular surface of
the humeral head.

No other acute fracture or dislocation. Glenohumeral joint space is
maintained. Normal acromioclavicular joint. Type I acromion. No
significant joint effusion. No aggressive osseous lesion.

Ligaments

Ligaments are suboptimally evaluated by CT.

Muscles and Tendons
No muscle atrophy. No intramuscular fluid collection or hematoma.
Mild expansion of the anterior deltoid likely reflecting contusion
versus muscle strain.

Soft tissue
No fluid collection or hematoma. No soft tissue mass. Old ununited
left anterolateral fifth rib fracture.
IMPRESSION: 1. Severely comminuted fracture of the surgical neck of the left
proximal humerus with 2.5 cm of anterior displacement of the
proximal shaft. Comminuted fracture of greater tuberosity without
significant displacement. No involvement of the articular surface of
the humeral head.
2. Contusion versus partial tear of the anterior deltoid muscle
which contains a few bony fragments.
